# Patient Record
Sex: Male | Born: 1960 | Race: Black or African American | Hispanic: No | Marital: Married
Health system: Southern US, Community
[De-identification: ages and names within clinical notes are randomized; demographics above are authoritative.]

---

## 2020-04-02 ENCOUNTER — Ambulatory Visit: Admission: EM | Admit: 2020-04-02 | Discharge: 2020-04-02 | Discharge status: Against Medical Advice | Payer: Self-pay

## 2021-01-08 ENCOUNTER — Other Ambulatory Visit: Payer: Self-pay

## 2021-01-08 ENCOUNTER — Emergency Department (HOSPITAL_COMMUNITY)
Admission: EM | Admit: 2021-01-08 | Discharge: 2021-01-08 | Disposition: A | Discharge status: Home / Self Care | Payer: Self-pay | Attending: Emergency Medicine | Admitting: Emergency Medicine

## 2021-01-08 ENCOUNTER — Encounter (HOSPITAL_COMMUNITY): Payer: Self-pay

## 2021-01-08 ENCOUNTER — Emergency Department (HOSPITAL_COMMUNITY): Payer: Self-pay

## 2021-01-08 DIAGNOSIS — L237 Allergic contact dermatitis due to plants, except food: Secondary | ICD-10-CM

## 2021-01-08 DIAGNOSIS — I10 Essential (primary) hypertension: Secondary | ICD-10-CM | POA: Insufficient documentation

## 2021-01-08 DIAGNOSIS — Z79899 Other long term (current) drug therapy: Secondary | ICD-10-CM | POA: Insufficient documentation

## 2021-01-08 LAB — CBC WITH DIFFERENTIAL/PLATELET
Abs Immature Granulocytes: 0.01 10*3/uL (ref 0.00–0.07)
Basophils Absolute: 0 10*3/uL (ref 0.0–0.1)
Basophils Relative: 0 %
Eosinophils Absolute: 0.2 10*3/uL (ref 0.0–0.5)
Eosinophils Relative: 3 %
HCT: 52.4 % — ABNORMAL HIGH (ref 39.0–52.0)
Hemoglobin: 16.9 g/dL (ref 13.0–17.0)
Immature Granulocytes: 0 %
Lymphocytes Relative: 50 %
Lymphs Abs: 2.4 10*3/uL (ref 0.7–4.0)
MCH: 27.4 pg (ref 26.0–34.0)
MCHC: 32.3 g/dL (ref 30.0–36.0)
MCV: 85.1 fL (ref 80.0–100.0)
Monocytes Absolute: 0.4 10*3/uL (ref 0.1–1.0)
Monocytes Relative: 9 %
Neutro Abs: 1.8 10*3/uL (ref 1.7–7.7)
Neutrophils Relative %: 38 %
Platelets: 210 10*3/uL (ref 150–400)
RBC: 6.16 MIL/uL — ABNORMAL HIGH (ref 4.22–5.81)
RDW: 15.1 % (ref 11.5–15.5)
WBC: 4.8 10*3/uL (ref 4.0–10.5)
nRBC: 0 % (ref 0.0–0.2)

## 2021-01-08 LAB — COMPREHENSIVE METABOLIC PANEL
ALT: 21 U/L (ref 0–44)
AST: 24 U/L (ref 15–41)
Albumin: 4.5 g/dL (ref 3.5–5.0)
Alkaline Phosphatase: 72 U/L (ref 38–126)
Anion gap: 9 (ref 5–15)
BUN: 14 mg/dL (ref 6–20)
CO2: 31 mmol/L (ref 22–32)
Calcium: 9.6 mg/dL (ref 8.9–10.3)
Chloride: 97 mmol/L — ABNORMAL LOW (ref 98–111)
Creatinine, Ser: 1.21 mg/dL (ref 0.61–1.24)
GFR, Estimated: >60 mL/min (ref >60)
Glucose, Bld: 91 mg/dL (ref 70–99)
Potassium: 3.2 mmol/L — ABNORMAL LOW (ref 3.5–5.1)
Sodium: 137 mmol/L (ref 135–145)
Total Bilirubin: 0.8 mg/dL (ref 0.3–1.2)
Total Protein: 8.4 g/dL — ABNORMAL HIGH (ref 6.5–8.1)

## 2021-01-08 MED ORDER — POTASSIUM CHLORIDE CRYS ER 20 MEQ PO TBCR
40.0000 meq | EXTENDED_RELEASE_TABLET | Freq: Once | ORAL | Status: AC
  Administered 2021-01-08: 40 meq via ORAL
  Filled 2021-01-08: qty 2

## 2021-01-08 MED ORDER — AMLODIPINE BESYLATE 5 MG PO TABS: 5.0000 mg | ORAL_TABLET | Freq: Every day | ORAL | 0 refills | Status: AC

## 2021-01-08 MED ORDER — HYDROXYZINE HCL 25 MG PO TABS: 25.0000 mg | ORAL_TABLET | Freq: Four times a day (QID) | ORAL | 0 refills | Status: AC | PRN

## 2021-01-08 MED ORDER — AMLODIPINE BESYLATE 5 MG PO TABS
5.0000 mg | ORAL_TABLET | Freq: Once | ORAL | Status: AC
  Administered 2021-01-08: 5 mg via ORAL
  Filled 2021-01-08: qty 1

## 2021-01-08 MED ORDER — PREDNISONE 10 MG PO TABS: ORAL_TABLET | ORAL | 0 refills | Status: AC

## 2021-01-08 MED ORDER — HYDRALAZINE HCL 10 MG PO TABS
10.0000 mg | ORAL_TABLET | Freq: Once | ORAL | Status: AC
  Administered 2021-01-08: 10 mg via ORAL
  Filled 2021-01-08: qty 1

## 2021-01-08 NOTE — ED Triage Notes (Signed)
Pt went to PCP for poison ivy to right forearm, was told to come to ED for elevated BP. BP 185/108 in triage.

## 2021-01-08 NOTE — ED Provider Notes (Signed)
[facility] Provider Note   CSN: [id number] Arrival date & time: 01/08/21  1808     History Chief Complaint  Patient presents with   Hypertension    Jason Arellano is a 60 y.o. male.  HPI     60yo male with no significant medical history presents with concern for poison ivy and elevated blood pressures at PCP office.  Has not been seeing a PCP  Denies numbness, weakness, difficulty talking or walking, visual changes or facial droop.   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 No pertinent past medical history.  There are no problems to display for this patient.   History reviewed. No pertinent surgical history.     History reviewed. No pertinent family history.     Home Medications Prior to Admission medications   Medication Sig Start Date End Date Taking? Authorizing Provider  amLODipine (NORVASC) 5 MG tablet Take 1 tablet (5 mg total) by mouth daily. 01/08/21  Yes Alvira Monday, MD  hydrOXYzine (ATARAX/VISTARIL) 25 MG tablet Take 1 tablet (25 mg total) by mouth every 6 (six) hours as needed for itching. 01/08/21  Yes Alvira Monday, MD  predniSONE (DELTASONE) 10 MG tablet Take 40mg  (4 tablets) for 2 days, 20mg (2 tablets) for 5 days, 10mg  for 5 days 01/08/21  Yes , MD    Allergies    Patient has no allergy information on record.  Review of Systems   Review of Systems  Constitutional:  Negative for fatigue and fever.  Respiratory:  Negative for cough and shortness of breath (not current, notes with exertion).   Cardiovascular:  Negative for chest pain and  leg swelling.  Gastrointestinal:  Negative for abdominal pain, nausea and vomiting.  Musculoskeletal:  Negative for back pain.  Skin:  Positive for rash.  Neurological:  Negative for dizziness, syncope, facial asymmetry, speech difficulty, weakness, numbness and headaches.   Physical Exam Updated Vital Signs BP (!) 167/103   Pulse 64   Temp 97.7 F (36.5 C) (Oral)   Resp 18   SpO2 98%   Physical Exam Vitals and nursing note reviewed.  Constitutional:      General: He is not in acute distress.    Appearance: He is well-developed. He is not diaphoretic.  HENT:     Head: Normocephalic and atraumatic.  Eyes:     Conjunctiva/sclera: Conjunctivae normal.  Cardiovascular:     Rate and Rhythm: Normal rate and regular rhythm.     Heart sounds: Normal heart sounds. No murmur heard.   No friction rub. No gallop.  Pulmonary:     Effort: Pulmonary effort is normal. No respiratory distress.     Breath sounds: Normal breath sounds. No wheezing or rales.  Abdominal:     General: There is no distension.     Palpations: Abdomen is soft.     Tenderness: There is no abdominal tenderness. There is no guarding.  Musculoskeletal:     Cervical back: Normal range of motion.  Skin:    General: Skin is warm and dry.  Neurological:     Mental  Status: He is alert and oriented to person, place, and time.    ED Results / Procedures / Treatments   Labs (all labs ordered are listed, but only abnormal results are displayed) Labs Reviewed  CBC WITH DIFFERENTIAL/PLATELET - Abnormal; Notable for the following components:      Result Value   RBC 6.16 (*)    HCT 52.4 (*)    All other components within normal limits  COMPREHENSIVE METABOLIC PANEL - Abnormal; Notable for the following components:   Potassium 3.2 (*)    Chloride 97 (*)    Total Protein 8.4 (*)    All other components within normal limits    EKG EKG Interpretation  Date/Time:  Thursday January 08 2021 19:53:12 EDT Ventricular Rate:   64 PR Interval:  153 QRS Duration: 91 QT Interval:  419 QTC Calculation: 433 R Axis:   0 Text Interpretation: Sinus rhythm Probable anteroseptal infarct, old No previous ECGs available Confirmed by Alvira Monday ([id number]) on 01/08/2021 8:11:38 PM  Radiology DG Chest 2 View  Result Date: 01/08/2021 CLINICAL DATA:  Shortness of breath on exertion.  Hypertension. EXAM: CHEST - 2 VIEW COMPARISON:  No comparison studies available. FINDINGS: The lungs are clear without focal pneumonia, edema, pneumothorax or pleural effusion. Cardiopericardial silhouette is at upper limits of normal for size. The visualized bony structures of the thorax show no acute abnormality. IMPRESSION: No active cardiopulmonary disease. Electronically Signed   By: Kennith Center M.D.   On: 01/08/2021 20:44    Procedures Procedures   Medications Ordered in ED Medications  hydrALAZINE (APRESOLINE) tablet 10 mg (10 mg Oral Given 01/08/21 1945)  amLODipine (NORVASC) tablet 5 mg (5 mg Oral Given 01/08/21 1945)  potassium chloride SA (KLOR-CON) CR tablet 40 mEq (40 mEq Oral Given 01/08/21 2107)    ED Course  I have reviewed the triage vital signs and the nursing notes.  Pertinent labs & imaging results that were available during my care of the patient were reviewed by me and considered in my medical decision making (see chart for details).    MDM Rules/Calculators/A&P                           60yo male with no significant medical history presents with concern for poison ivy and elevated blood pressures at PCP office.   Patient had been seen by primary care doctor with with concern for poison ivy exposure and rash.  Agree that presentation of rash is consistent with this.  He was given a prescription for cream by the primary care physician, did prescribe a prescription for prednisone just in case he is not improving with the cream, discussed risks of hyperglycemia.  Also give prescription for hydroxyzine.  Regarding blood  pressures, he is mostly asymptomatic.  On review of systems, does report some dyspnea on exertion which has been chronically present.  Chest x-ray was completed which shows no evidence of pulmonary edema, cardiomegaly.  EKG showed no significant findings. No chest pain, doubt ACS. Labs are completed which showed no sign of anemia, no significant electrolyte abnormalities.  Had mild hypokalemia and was given potassium replacement.  While he describes working as a Naval architect, he has no tachycardia, no hypoxia, denies significant shortness of breath, reports mild dyspnea on exertion for months or more and doubt acute PE.   Given hydralazine, amlodipine with improvement of blood pressures to 160s over 100.  Given prescription for amlodipine and recommend close follow-up  with a primary care physician.   Final Clinical Impression(s) / ED Diagnoses Final diagnoses:  Hypertension, unspecified type  Poison ivy dermatitis    Rx / DC Orders ED Discharge Orders          Ordered    amLODipine (NORVASC) 5 MG tablet  Daily        01/08/21 2100    predniSONE (DELTASONE) 10 MG tablet        01/08/21 2102    hydrOXYzine (ATARAX/VISTARIL) 25 MG tablet  Every 6 hours PRN        01/08/21 2102             Alvira Monday, MD 01/09/21 [phone]

## 2021-02-22 ENCOUNTER — Encounter (HOSPITAL_COMMUNITY): Payer: Self-pay

## 2021-02-22 ENCOUNTER — Emergency Department (HOSPITAL_COMMUNITY)
Admission: EM | Admit: 2021-02-22 | Discharge: 2021-02-22 | Disposition: A | Discharge status: Home / Self Care | Payer: Self-pay | Attending: Emergency Medicine | Admitting: Emergency Medicine

## 2021-02-22 ENCOUNTER — Other Ambulatory Visit: Payer: Self-pay

## 2021-02-22 DIAGNOSIS — I10 Essential (primary) hypertension: Secondary | ICD-10-CM | POA: Insufficient documentation

## 2021-02-22 DIAGNOSIS — H81399 Other peripheral vertigo, unspecified ear: Secondary | ICD-10-CM | POA: Insufficient documentation

## 2021-02-22 DIAGNOSIS — B351 Tinea unguium: Secondary | ICD-10-CM | POA: Insufficient documentation

## 2021-02-22 DIAGNOSIS — Z79899 Other long term (current) drug therapy: Secondary | ICD-10-CM | POA: Insufficient documentation

## 2021-02-22 MED ORDER — TERBINAFINE HCL 250 MG PO TABS: 250.0000 mg | ORAL_TABLET | Freq: Every day | ORAL | 0 refills | Status: AC

## 2021-02-22 MED ORDER — MECLIZINE HCL 25 MG PO TABS: 25.0000 mg | ORAL_TABLET | Freq: Two times a day (BID) | ORAL | 0 refills | Status: AC | PRN

## 2021-02-22 NOTE — ED Provider Notes (Signed)
[facility] Provider Note   CSN: [id number] Arrival date & time: 02/22/21  1027     History Chief Complaint  Patient presents with   Hypertension    [facility] Arellano is a 60 y.o. male.  60 year old male with history as below presented ER secondary to elevated blood pressure reading, vertiginous dizziness, toenail fungus.  Patient ports he has been taking his oral antihypertensive as prescribed.  [place] so he has not been able to follow-up with his PCP.  He says he has appointment with his PCP tomorrow morning.  Patient also reports prior history of vertigo, previously resolved with meclizine.  Reports that yesterday he was getting up quickly from a seated position and felt a vertiginous dizziness.  Sudden onset.  All spontaneously after sitting back down.  Not associated with nausea, vomiting, headaches, numbness or tingling.  No falls.  Patient reports he did have some transient vision changes while he was experiencing vertiginous dizziness that he states occurred in the past with prior episodes of vertigo.  No fevers, chills, neck pain.  No IV drug use.  She also reports he believes he has a toenail fungus on his right great toe.  Requesting an ointment for his toe nail.  The history is provided by the patient. No language interpreter was used.  Hypertension Pertinent negatives include no chest pain, no abdominal pain, no headaches and no shortness of breath.      History reviewed. No pertinent past medical history.  There are no problems to display for this patient.   History reviewed. No pertinent surgical history.     History reviewed. No pertinent family history.     Home Medications Prior to Admission medications   Medication Sig Start Date End Date Taking? Authorizing Provider  meclizine (ANTIVERT) 25 MG tablet Take 1 tablet (25 mg total) by mouth 2 (two) times daily as needed for dizziness. 02/22/21  Yes Tanda Rockers A,  DO  terbinafine (LAMISIL) 250 MG tablet Take 1 tablet (250 mg total) by mouth daily. 02/22/21 05/23/21 Yes Tanda Rockers A, DO  amLODipine (NORVASC) 5 MG tablet Take 1 tablet (5 mg total) by mouth daily. 01/08/21   Alvira Monday, MD  hydrOXYzine (ATARAX/VISTARIL) 25 MG tablet Take 1 tablet (25 mg total) by mouth every 6 (six) hours as needed for itching. 01/08/21   Alvira Monday, MD  predniSONE (DELTASONE) 10 MG tablet Take 40mg  (4 tablets) for 2 days, 20mg (2 tablets) for 5 days, 10mg  for 5 days 01/08/21   , MD    Allergies    Patient has no known allergies.  Review of Systems   Review of Systems  Constitutional:  Negative for chills and fever.  HENT:  Negative for facial swelling and trouble swallowing.   Eyes:  Negative for photophobia and pain.  Respiratory:  Negative for cough and shortness of breath.   Cardiovascular:  Negative for chest pain and palpitations.  Gastrointestinal:  Negative for abdominal pain, nausea and vomiting.  Endocrine: Negative for polydipsia and polyuria.  Genitourinary:  Negative for difficulty urinating and hematuria.  Musculoskeletal:  Negative for gait problem and joint swelling.  Skin:  Negative for pallor and rash.       Toenail abnormality   Neurological:  Negative for syncope and headaches.       Room spinning sensation  Psychiatric/Behavioral:  Negative for agitation and confusion.    Physical Exam Updated Vital Signs BP (!) 183/109   Pulse 76  Temp 98.8 F (37.1 C) (Oral)   Resp 16   Ht 5\' 7"  (1.702 m)   Wt 85.9 kg   SpO2 100%   BMI 29.66 kg/m   Physical Exam Vitals and nursing note reviewed.  Constitutional:      General: He is not in acute distress.    Appearance: He is well-developed.  HENT:     Head: Normocephalic and atraumatic. No raccoon eyes, Battle's sign, right periorbital erythema or left periorbital erythema.     Right Ear: Tympanic membrane and external ear normal.     Left Ear: Tympanic membrane and  external ear normal.     Mouth/Throat:     Mouth: Mucous membranes are moist.  Eyes:     General: No scleral icterus.    Extraocular Movements: Extraocular movements intact.     Pupils: Pupils are equal, round, and reactive to light.     Comments: Fatigable horizontal nystagmus  Cardiovascular:     Rate and Rhythm: Normal rate and regular rhythm.     Pulses: Normal pulses.          Radial pulses are 2+ on the right side and 2+ on the left side.       Posterior tibial pulses are 2+ on the right side and 2+ on the left side.     Heart sounds: Normal heart sounds.  Pulmonary:     Effort: Pulmonary effort is normal. No respiratory distress.     Breath sounds: Normal breath sounds.  Abdominal:     General: Abdomen is flat.     Palpations: Abdomen is soft.     Tenderness: There is no abdominal tenderness.  Musculoskeletal:        General: Normal range of motion.     Cervical back: Full passive range of motion without pain and normal range of motion.     Right lower leg: No edema.     Left lower leg: No edema.  Feet:     Comments: Onychomycosis  right great toe Skin:    General: Skin is warm and dry.     Capillary Refill: Capillary refill takes less than 2 seconds.  Neurological:     Mental Status: He is alert and oriented to person, place, and time.     GCS: GCS eye subscore is 4. GCS verbal subscore is 5. GCS motor subscore is 6.     Cranial Nerves: Cranial nerves are intact.     Sensory: Sensation is intact.     Motor: Motor function is intact.     Coordination: Coordination is intact. Finger-Nose-Finger Test normal.     Gait: Gait is intact.     Comments: HINTs exam consistent with peripheral vertigo  Psychiatric:        Mood and Affect: Mood normal.        Behavior: Behavior normal.    ED Results / Procedures / Treatments   Labs (all labs ordered are listed, but only abnormal results are displayed) Labs Reviewed - No data to display  EKG None  Radiology No results  found.  Procedures Procedures   Medications Ordered in ED Medications - No data to display  ED Course  I have reviewed the triage vital signs and the nursing notes.  Pertinent labs & imaging results that were available during my care of the patient were reviewed by me and considered in my medical decision making (see chart for details).    MDM Rules/Calculators/A&P  This is a 60 year old male present to the ER secondary to elevated blood pressure reading, vertigo, toenail fungus.  He has no chest pain, dyspnea, diaphoresis, nausea, no shortness of breath.  He has been taking his medications as prescribed.  Has not yet followed up with PCP but has appointment tomorrow for repeat evaluation of hypertension.  Patient is nontoxic-appearing.  Signs reviewed.  Mildly elevated blood pressure.  Serious etiology considered   Patient with asymptomatic hypertension, he was started on antihypertensive approxi-1 month ago and has been taking as prescribed.  He is also started reducing his oral salt intake and eating more fruits and vegetables with his diet.  He feels as though his job causes him increased stress which causes blood pressure to increase.  He has appoint with PCP tomorrow.  Patient with vertiginous dizziness that is previously diagnosed.  Recurrence of symptoms yesterday.  Sudden onset.  No tinnitus.  TM clear bilateral.  Neurologic exam is nonfocal.  Currently asymptomatic.  Physical exam is consistent with peripheral vertigo.  Supportive care at home for vertigo. Vertigo most suggestive of peripheral cause. Neuro intact without sign of CNS ischemia or other serious etiology. DC on Meclizine with close PCP F/U. Warnings discussed.  Patient with likely Onychomycosis of the right great toe.  Start oral antifungal.  Patient follow-up with PCP tomorrow regarding further evaluation.  Consider podiatry evaluation if symptoms not improved.   The patient improved  significantly and was discharged in stable condition. Detailed discussions were had with the patient regarding current findings, and need for close f/u with PCP or on call doctor. The patient has been instructed to return immediately if the symptoms worsen in any way for re-evaluation. Patient verbalized understanding and is in agreement with current care plan. All questions answered prior to discharge.      Final Clinical Impression(s) / ED Diagnoses Final diagnoses:  Hypertension, unspecified type  Onychomycosis  Peripheral vertigo, unspecified laterality    Rx / DC Orders ED Discharge Orders          Ordered    meclizine (ANTIVERT) 25 MG tablet  2 times daily PRN        02/22/21 1602    terbinafine (LAMISIL) 250 MG tablet  Daily        02/22/21 1605             Sloan Leiter, DO 02/22/21 1619

## 2021-02-22 NOTE — ED Triage Notes (Signed)
Pt reports starting Losartan about a month ago for high BP. Pt reports his BP is still high. Pt denies any other symptoms.

## 2021-02-22 NOTE — ED Provider Notes (Signed)
Emergency Medicine Provider Triage Evaluation Note  [facility] Arellano , a 60 y.o. male  was evaluated in triage.  Pt complains of high blood pressure.  He has been taking his amlodipine.  He does not currently have any symptoms.  He has been having blurred vision occasionally, none now.    Patient than became upset, loudly talking about how he needs to leave this state, asking me what he is supposed to do about the bills he is getting sent for his last ED visit and what he needs to do to get medicare.    I informed patient that that is not something I have control over, reccommended he contact the financial aid department during the week day to discuss financials   Review of Systems  Positive: Hypertension Negative: Chest pain, headache  Physical Exam  BP (!) 154/106 (BP Location: Left Arm)   Pulse (!) 101   Temp 98.8 F (37.1 C) (Oral)   Resp 16   Ht 5\' 7"  (1.702 m)   Wt 85.9 kg   SpO2 100%   BMI 29.66 kg/m  Gen:   Awake, no distress   Resp:  Normal effort  MSK:   Moves extremities without difficulty  Other:  Normal gait  Medical Decision Making  Medically screening exam initiated at 11:25 AM.  Appropriate orders placed.  Bisig was informed that the remainder of the evaluation will be completed by another provider, this initial triage assessment does not replace that evaluation, and the importance of remaining in the ED until their evaluation is complete.     Para March, PA-C 02/22/21 1135    02/24/21, DO 02/22/21 1511

## 2021-02-22 NOTE — Discharge Instructions (Addendum)
Please go to your PCP tomorrow for further evaluation of your elevated blood pressure reading.

## 2022-08-09 DIAGNOSIS — Z125 Encounter for screening for malignant neoplasm of prostate: Secondary | ICD-10-CM | POA: Diagnosis not present

## 2022-08-09 DIAGNOSIS — Z0000 Encounter for general adult medical examination without abnormal findings: Secondary | ICD-10-CM | POA: Diagnosis not present

## 2022-08-09 DIAGNOSIS — E6609 Other obesity due to excess calories: Secondary | ICD-10-CM | POA: Diagnosis not present

## 2022-08-09 DIAGNOSIS — E782 Mixed hyperlipidemia: Secondary | ICD-10-CM | POA: Diagnosis not present

## 2022-08-09 DIAGNOSIS — R7303 Prediabetes: Secondary | ICD-10-CM | POA: Diagnosis not present

## 2022-08-09 DIAGNOSIS — I10 Essential (primary) hypertension: Secondary | ICD-10-CM | POA: Diagnosis not present

## 2022-08-09 DIAGNOSIS — N289 Disorder of kidney and ureter, unspecified: Secondary | ICD-10-CM | POA: Diagnosis not present

## 2022-08-13 IMAGING — CR DG CHEST 2V
2 series · 2 of 2 positions shown · non-contrast
Comparison: No comparison studies available.

CLINICAL DATA: Shortness of breath on exertion.  Hypertension.

EXAM:
CHEST - 2 VIEW

[w chest pa]
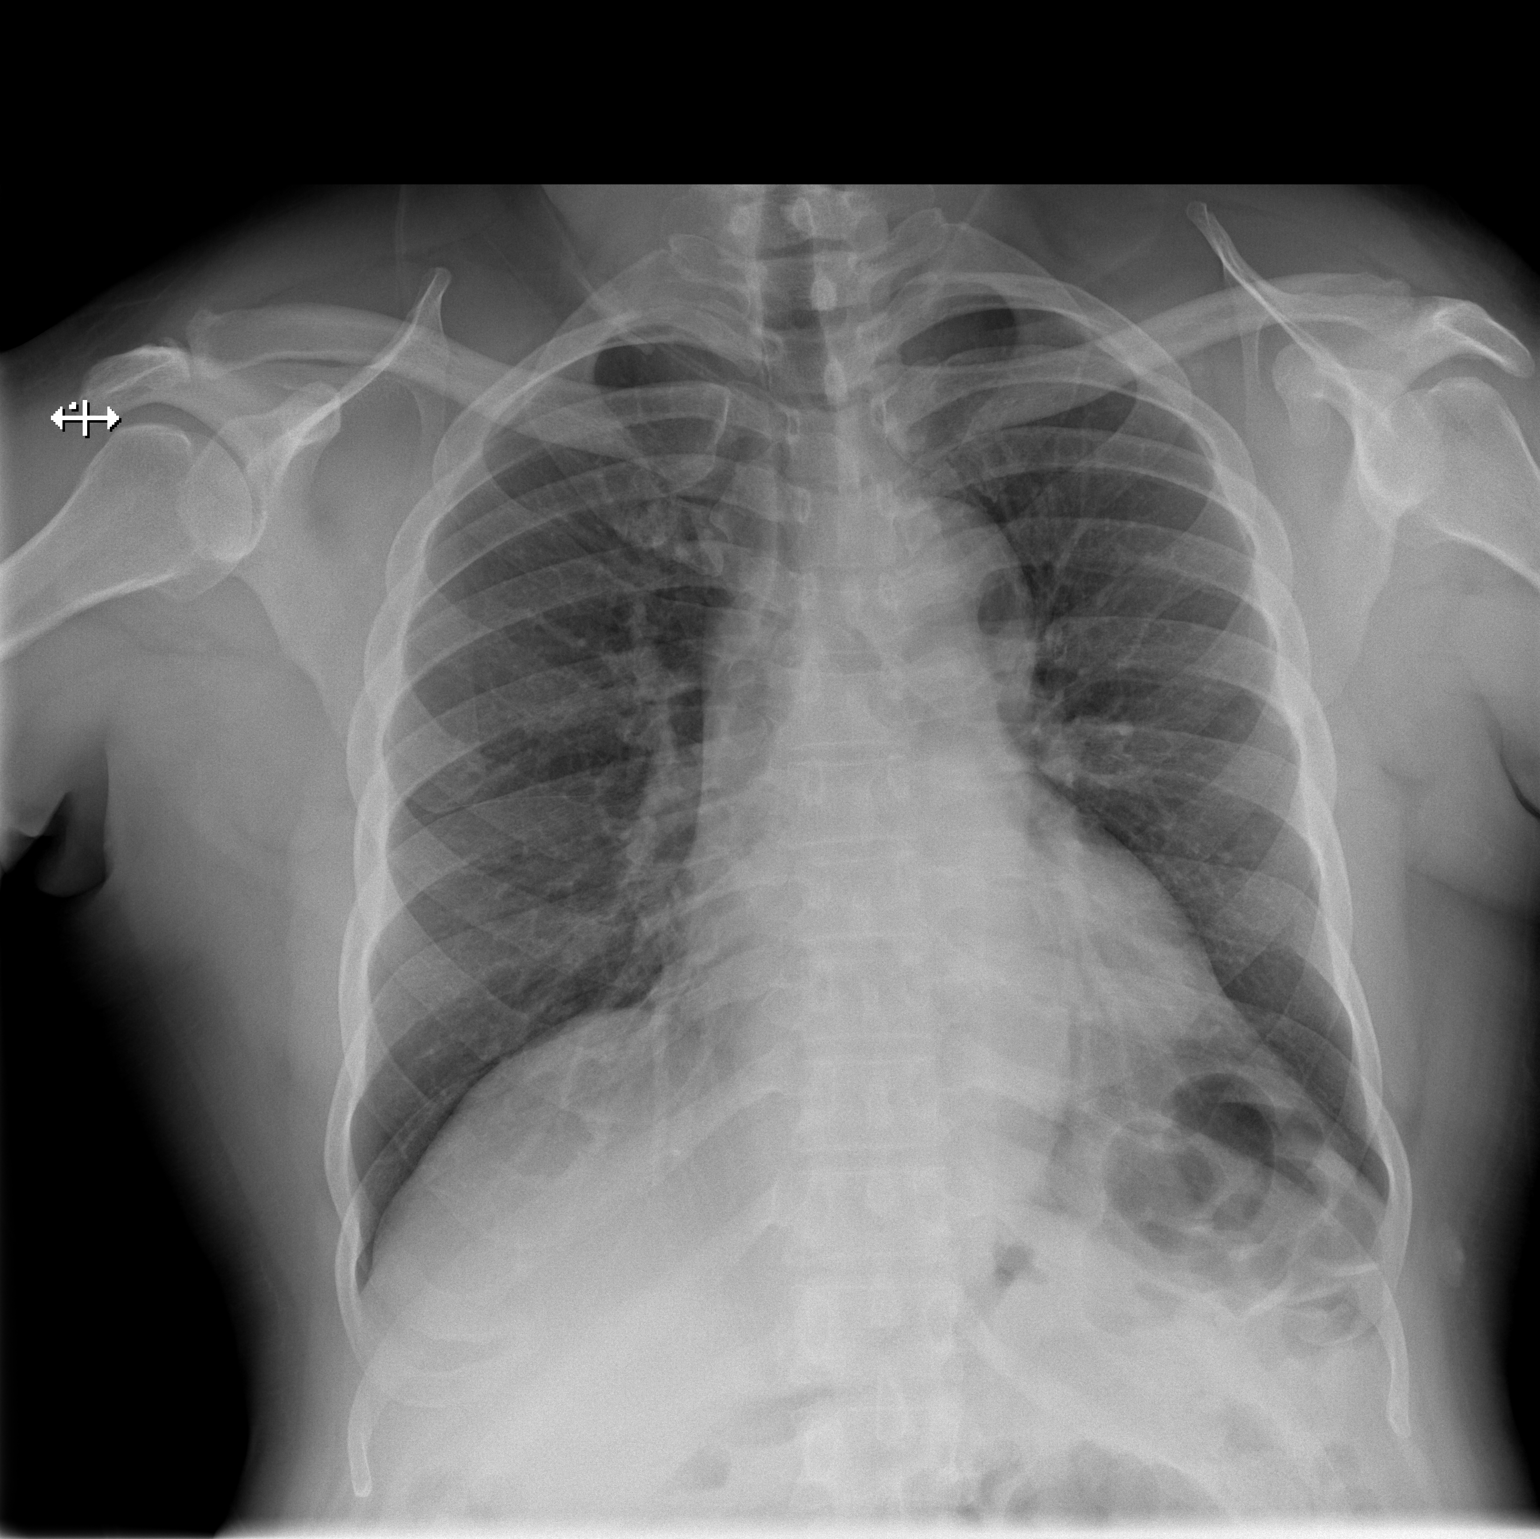

[w chest lat]
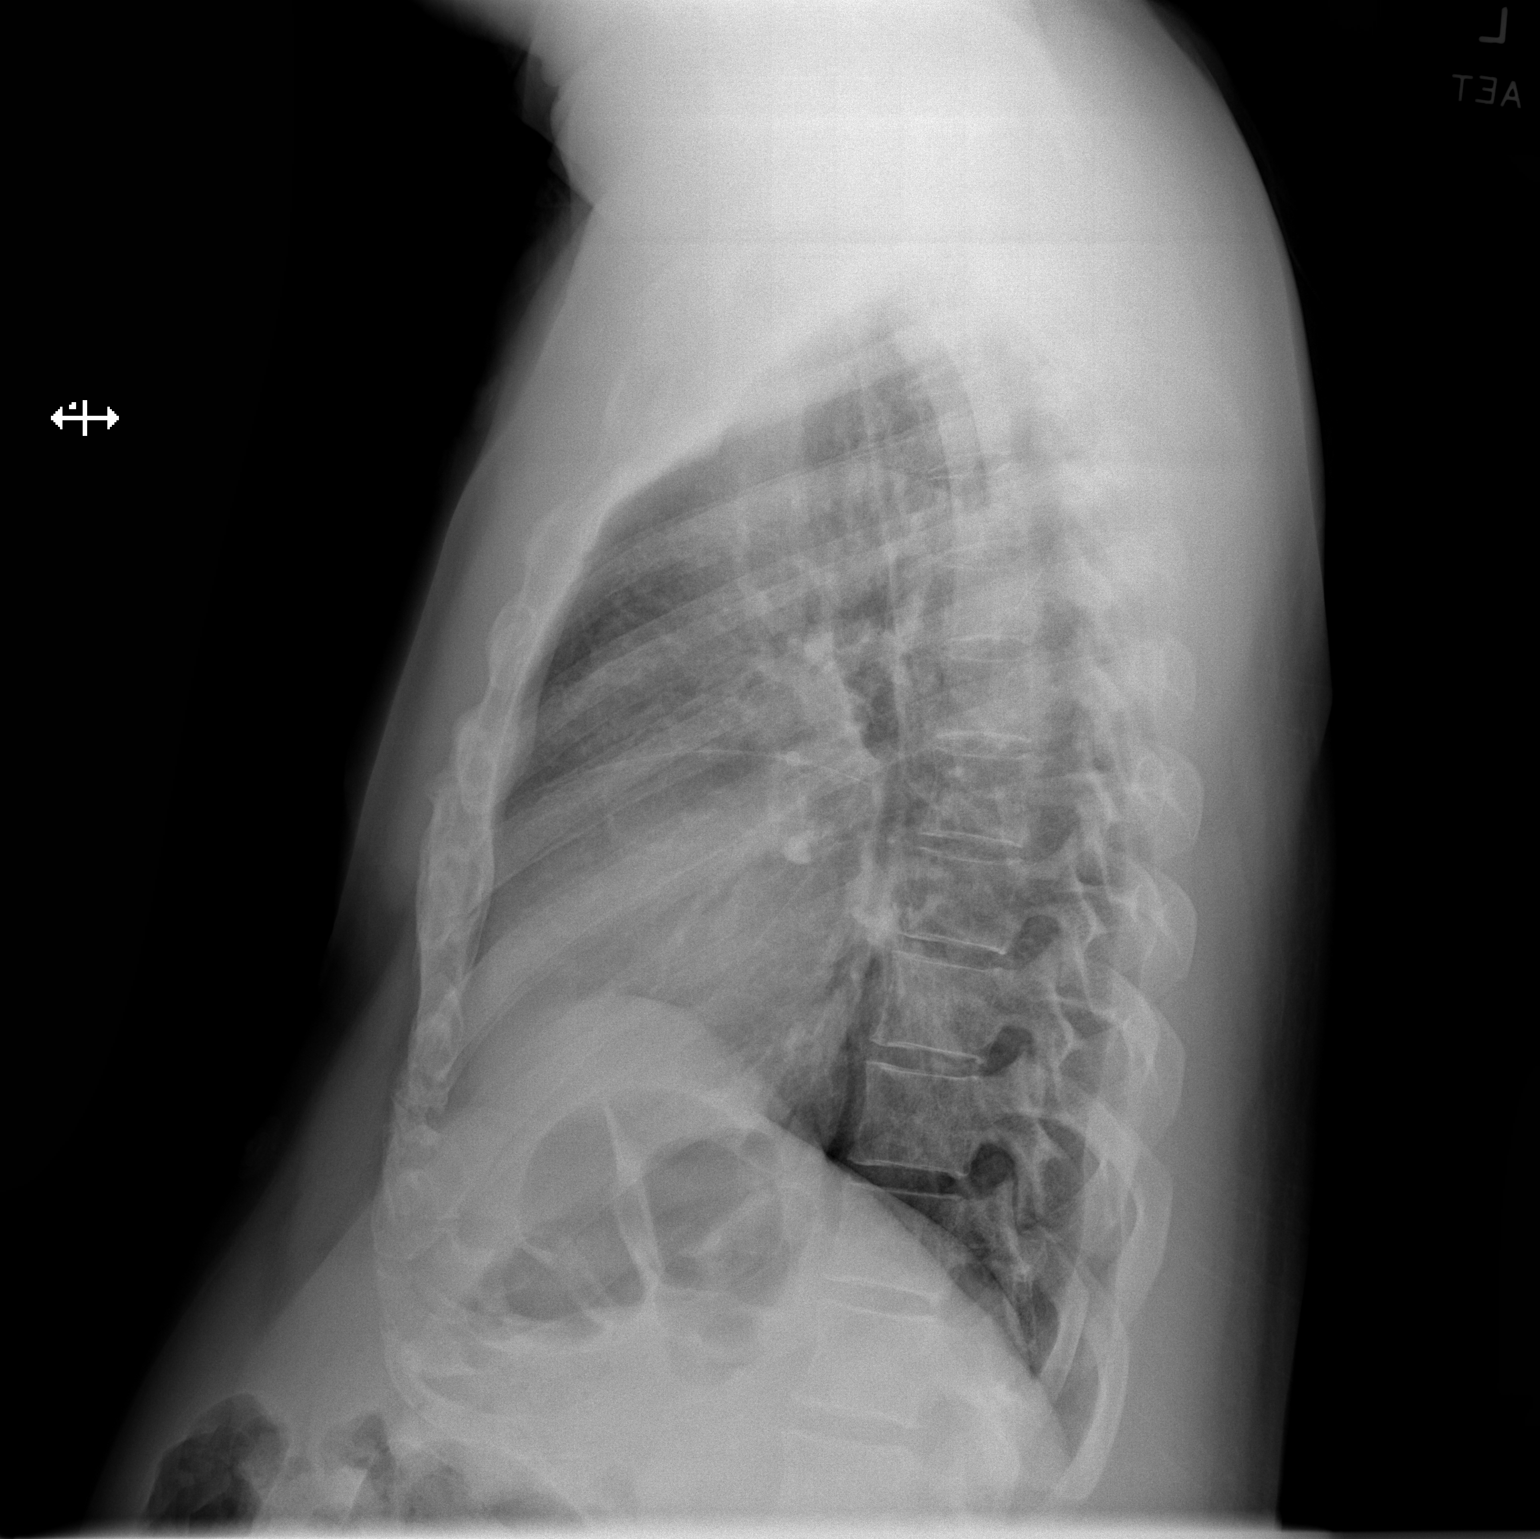

[2 of 2 positions shown; findings below may reference images not displayed]

FINDINGS: The lungs are clear without focal pneumonia, edema, pneumothorax or
pleural effusion. Cardiopericardial silhouette is at upper limits of
normal for size. The visualized bony structures of the thorax show
no acute abnormality.
IMPRESSION: No active cardiopulmonary disease.

## 2022-11-10 ENCOUNTER — Telehealth: Payer: Self-pay

## 2022-11-10 NOTE — Telephone Encounter (Signed)
LVM for patient to call back [phone], or to call PCP office to schedule follow up apt. AS, CMA
